# Patient Record
Sex: Male | Born: 2005 | Race: White | Hispanic: No | Marital: Single | Smoking: Never smoker
Health system: Southern US, Community
[De-identification: ages and names within clinical notes are randomized; demographics above are authoritative.]

## PROBLEM LIST (undated history)

## (undated) ENCOUNTER — Ambulatory Visit: Source: Home / Self Care

## (undated) ENCOUNTER — Emergency Department (HOSPITAL_BASED_OUTPATIENT_CLINIC_OR_DEPARTMENT_OTHER): Admission: EM | Source: Home / Self Care

## (undated) DIAGNOSIS — IMO0002 Reserved for concepts with insufficient information to code with codable children: Secondary | ICD-10-CM

## (undated) HISTORY — DX: Reserved for concepts with insufficient information to code with codable children: IMO0002

## (undated) HISTORY — PX: CIRCUMCISION: SUR203

---
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
## 2023-05-31 ENCOUNTER — Emergency Department (HOSPITAL_BASED_OUTPATIENT_CLINIC_OR_DEPARTMENT_OTHER)
Admission: EM | Admit: 2023-05-31 | Discharge: 2023-05-31 | Disposition: A | Discharge status: Home / Self Care | Attending: Emergency Medicine | Admitting: Emergency Medicine

## 2023-05-31 ENCOUNTER — Encounter: Payer: Self-pay | Admitting: *Deleted

## 2023-05-31 ENCOUNTER — Ambulatory Visit
Admission: EM | Admit: 2023-05-31 | Discharge: 2023-05-31 | Disposition: A | Discharge status: Home / Self Care | Attending: Emergency Medicine | Admitting: Emergency Medicine

## 2023-05-31 ENCOUNTER — Other Ambulatory Visit: Payer: Self-pay

## 2023-05-31 DIAGNOSIS — Y9372 Activity, wrestling: Secondary | ICD-10-CM | POA: Diagnosis not present

## 2023-05-31 DIAGNOSIS — S4992XA Unspecified injury of left shoulder and upper arm, initial encounter: Secondary | ICD-10-CM

## 2023-05-31 DIAGNOSIS — M25512 Pain in left shoulder: Secondary | ICD-10-CM | POA: Insufficient documentation

## 2023-05-31 NOTE — ED Triage Notes (Addendum)
 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 States he was "coming off the top rope" and was "drop kicked on left shoulder". Has full ROM but c/o "very sore towards" my neck"

## 2023-05-31 NOTE — ED Provider Notes (Signed)
 [name]    CSN: [id number] Arrival date & time: 05/31/23  1206      History   Chief Complaint Chief Complaint  Patient presents with   Shoulder Injury    HPI Aaron Vazquez is a 18 y.o. male.  Here with left shoulder pain/injury that occurred last night Was wrestling, kicked in shoulder by opponent, also landed on shoulder. Pain today rated 5/10 in the muscles leading to left neck, and anterior shoulder. 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 Authorizing Provider  cetirizine  (ZYRTEC  ALLERGY) 10 MG tablet Take 1 tablet (10 mg total) by mouth daily. 05/16/22  Yes Herrin, Naishai R, MD  fluticasone  (FLONASE ) 50 MCG/ACT nasal spray Place 1 spray into both nostrils daily. 05/16/22  Yes Herrin, Alric Jensen, MD    Family History Family History  Adopted: Yes  Problem Relation Age of Onset   Hypertension Other        family history    Social History Social History   Tobacco Use   Smoking status: Never   Smokeless tobacco: Never  Substance Use Topics   Alcohol use: Never   Drug use: Never     Allergies   Cough dm [dextromethorphan polistirex er]   Review of Systems Review of Systems As per HPI  Physical Exam Triage Vital Signs ED Triage Vitals  Encounter Vitals Group     BP      Systolic BP Percentile      Diastolic BP Percentile      Pulse      Resp      Temp      Temp src      SpO2      Weight      Height      Head Circumference      Peak  Flow      Pain Score      Pain Loc      Pain Education      Exclude from Growth Chart    No data found.  Updated Vital Signs BP 121/77 (BP Location: Right Arm)   Pulse 69   Temp 98.4 F (36.9 C) (Oral)   Resp 16   SpO2 98%    Physical Exam Vitals and nursing note reviewed.  Constitutional:      General: He is not in acute distress. HENT:     Mouth/Throat:     Pharynx: Oropharynx is clear.  Cardiovascular:     Rate and Rhythm: Normal rate and regular rhythm.     Pulses: Normal pulses.     Heart sounds: Normal heart sounds.  Pulmonary:     Effort: Pulmonary effort is normal.     Breath sounds: Normal breath sounds.  Musculoskeletal:     Right shoulder: Normal.     Left shoulder: Tenderness and bony tenderness present. No swelling or deformity. Normal range of motion.  Arms:     Cervical back: Normal range of motion. No rigidity or tenderness.     Comments: Full ROM of left shoulder without pain. There is muscular and bony tenderness. Strength 5/5, sensation equal and intact. Radial pulses 2+  Lymphadenopathy:     Cervical: No cervical adenopathy.  Skin:    General: Skin is warm and dry.     Capillary Refill: Capillary refill takes less than 2 seconds.  Neurological:     Mental Status: He is alert and oriented to person, place, and time.     UC Treatments / Results  Labs (all labs ordered are listed, but only abnormal results are displayed) Labs Reviewed - No data to display  EKG  Radiology No results found.  Procedures Procedures  Medications Ordered in UC Medications - No data to display  Initial Impression / Assessment and Plan / UC Course  I have reviewed the triage vital signs and the nursing notes.  Pertinent labs & imaging results that were available during my care of the patient were reviewed by me and considered in my medical decision making (see chart for details).  No xray imaging is available at this clinic today Lower concern for  bony abnormality at this time. Recommend supportive care with tylenol /ibuprofen  every 4-6 hours, hot pad or ice, and monitoring. If worsening pain or inability to move the arm, advised re-evaluation for imaging, either at different site with available imaging or in the ED.  I have placed an order for outpatient xray, patient can have done if he chooses.  Initially ordered imaging for [facility]. However staff informed patient to go to [facility]. A new order is placed for different site - other order can be discontinued if needed.  Final Clinical Impressions(s) / UC Diagnoses   Final diagnoses:  Injury of left shoulder, initial encounter     Discharge Instructions      I have placed an order for left shoulder xray to be done at [facility]  Please go through the ED and state you are there for an xray with an order that has already been placed. Do not check in as a patient, because you have already been seen by urgent care!     ED Prescriptions   None    PDMP not reviewed this encounter.   Hikari Tripp, Ivette Marks, [place] 05/31/23 1328

## 2023-05-31 NOTE — Discharge Instructions (Signed)
 I have placed an order for left shoulder xray to be done at [facility]  Please go through the ED and state you are there for an xray with an order that has already been placed. Do not check in as a patient, because you have already been seen by urgent care!

## 2023-06-05 ENCOUNTER — Other Ambulatory Visit: Payer: Self-pay

## 2023-06-05 ENCOUNTER — Emergency Department (HOSPITAL_BASED_OUTPATIENT_CLINIC_OR_DEPARTMENT_OTHER)

## 2023-06-05 ENCOUNTER — Emergency Department (HOSPITAL_BASED_OUTPATIENT_CLINIC_OR_DEPARTMENT_OTHER)
Admission: EM | Admit: 2023-06-05 | Discharge: 2023-06-05 | Disposition: A | Discharge status: Home / Self Care | Attending: Emergency Medicine | Admitting: Emergency Medicine

## 2023-06-05 ENCOUNTER — Encounter (HOSPITAL_BASED_OUTPATIENT_CLINIC_OR_DEPARTMENT_OTHER): Payer: Self-pay | Admitting: Emergency Medicine

## 2023-06-05 DIAGNOSIS — Y9372 Activity, wrestling: Secondary | ICD-10-CM | POA: Insufficient documentation

## 2023-06-05 DIAGNOSIS — W500XXA Accidental hit or strike by another person, initial encounter: Secondary | ICD-10-CM | POA: Insufficient documentation

## 2023-06-05 DIAGNOSIS — M25512 Pain in left shoulder: Secondary | ICD-10-CM | POA: Diagnosis not present

## 2023-06-05 NOTE — ED Triage Notes (Signed)
 Pt with left shoulder pain after injury last Saturday. Was seen at that time.  Reports stretching today and felt a "pop" and is wondering if he re injured something.

## 2023-06-05 NOTE — Discharge Instructions (Addendum)
 Tylenol  or Motrin  as needed for pain.  Can ice and heat the area.  X-ray here does not show any evidence of broken bones or dislocations.  Make sure to follow-up outpatient, return for any worsening symptoms

## 2023-06-05 NOTE — ED Provider Notes (Signed)
 [facility] Provider Note   CSN: [id number] Arrival date & time: 06/05/23  1555    History  Chief Complaint  Patient presents with   Shoulder Pain    Aaron Vazquez is a 18 y.o. male here for evaluation of left shoulder pain.  Initial injury on Saturday.  Was wrestling again when he hit his shoulder.  No numbness or weakness.  He thought he felt something "pop."  He has full range of motion however was concerned he had dislocated his shoulder.  No head injury, neck injury.  No redness or warmth.  Has been wearing a sling at home intermittently.  HPI     Home Medications Prior to Admission medications   Medication Sig Start Date End Date Taking? Authorizing Provider  cetirizine  (ZYRTEC  ALLERGY) 10 MG tablet Take 1 tablet (10 mg total) by mouth daily. 05/16/22   Herrin, Naishai R, MD  fluticasone  (FLONASE ) 50 MCG/ACT nasal spray Place 1 spray into both nostrils daily. 05/16/22   Herrin, Naishai R, MD      Allergies    Cough dm [dextromethorphan polistirex er]    Review of Systems   Review of Systems  Constitutional: Negative.   HENT: Negative.    Respiratory: Negative.    Cardiovascular: Negative.   Gastrointestinal: Negative.   Genitourinary: Negative.   Musculoskeletal:        Left shoulder pain  Skin: Negative.   Neurological: Negative.   All other systems reviewed and are negative.   Physical Exam Updated Vital Signs BP 118/64 (BP Location: Right Arm)   Pulse (!) 57   Temp 97.9 F (36.6 C) (Oral)   Resp 18   SpO2 100%  Physical Exam Vitals and nursing note reviewed.  Constitutional:      General: He is not in acute distress.    Appearance: He is well-developed. He is not ill-appearing, toxic-appearing or diaphoretic.  HENT:     Head: Atraumatic.  Eyes:     Pupils: Pupils are equal, round, and reactive to light.  Cardiovascular:     Rate and Rhythm: Normal rate and regular rhythm.     Pulses:          Radial  pulses are 2+ on the right side and 2+ on the left side.  Pulmonary:     Effort: Pulmonary effort is normal. No respiratory distress.  Abdominal:     General: There is no distension.     Palpations: Abdomen is soft.  Musculoskeletal:        General: Normal range of motion.       Arms:     Cervical back: Normal range of motion and neck supple.     Comments: Mild tenderness left lateral shoulder.  Nontender clavicle, scapula.  Full range of motion.  Negative Hawkins, empty can.  No obvious dislocation.  Skin:    General: Skin is warm and dry.     Capillary Refill: Capillary refill takes less than 2 seconds.     Comments: No edema, erythema, warmth, fluctuance induration.  Neurological:     General: No focal deficit present.     Mental Status: He is alert and oriented to person, place, and time.     Sensory: Sensation is intact.     Motor: Motor function is intact.     Comments: Intact sensation Equal strength     ED Results / Procedures / Treatments   Labs (all labs ordered are listed, but only  abnormal results are displayed) Labs Reviewed - No data to display  EKG None  Radiology DG Shoulder Left Result Date: 06/05/2023 CLINICAL DATA:  Popping sensation after injury to the left shoulder during wrestling EXAM: LEFT SHOULDER - 3 VIEW COMPARISON:  Left shoulder radiograph dated 03/31/2023 FINDINGS: There is no evidence of fracture or dislocation. There is no evidence of arthropathy or other focal bone abnormality. Soft tissues are unremarkable. IMPRESSION: No acute fracture or dislocation. Electronically Signed   By: Limin  Xu M.D.   On: 06/05/2023 16:19    Procedures Procedures    Medications Ordered in ED Medications - No data to display  ED Course/ Medical Decision Making/ A&P   18 year old here for evaluation of left shoulder pain. Initial injury on Saturday. Was wrestling he felt like his left shoulder "popped."  He has had full range of motion however is concerned  that it may happen again.  He is neurovascularly intact.  He has no obvious skin changes to suggest septic joint, gout, hemarthrosis.  No bony tenderness.  No base dislocation.  Will plan on imaging  Imaging personally viewed and interpreted:  No fracture, dislocation or effusion.  Encouraged symptomatic management, rest and sling, follow-up outpatient.  Discussed possibility of recurrence if he did have a dislocation.  He will return for new or worsening symptoms.  Follow up with orthopedics  The patient has been appropriately medically screened and/or stabilized in the ED. I have low suspicion for any other emergent medical condition which would require further screening, evaluation or treatment in the ED or require inpatient management.  Patient is hemodynamically stable and in no acute distress.  Patient able to ambulate in department prior to ED.  Evaluation does not show acute pathology that would require ongoing or additional emergent interventions while in the emergency department or further inpatient treatment.  I have discussed the diagnosis with the patient and answered all questions.  Pain is been managed while in the emergency department and patient has no further complaints prior to discharge.  Patient is comfortable with plan discussed in room and is stable for discharge at this time.  I have discussed strict return precautions for returning to the emergency department.  Patient was encouraged to follow-up with PCP/specialist refer to at discharge.                                    Medical Decision Making Amount and/or Complexity of Data Reviewed Independent Historian: parent External Data Reviewed: radiology and notes. Radiology: ordered and independent interpretation performed. Decision-making details documented in ED Course.  Risk OTC drugs. Decision regarding hospitalization. Diagnosis or treatment significantly limited by social determinants of  health.          Final Clinical Impression(s) / ED Diagnoses Final diagnoses:  Acute pain of left shoulder    Rx / DC Orders ED Discharge Orders     None         Mayuri Staples A, PA-C 06/05/23 2323    Tonya Fredrickson, MD 06/06/23 1025

## 2023-08-22 DIAGNOSIS — Z113 Encounter for screening for infections with a predominantly sexual mode of transmission: Secondary | ICD-10-CM | POA: Diagnosis not present

## 2023-08-22 DIAGNOSIS — R5383 Other fatigue: Secondary | ICD-10-CM | POA: Diagnosis not present

## 2023-08-22 DIAGNOSIS — Z1329 Encounter for screening for other suspected endocrine disorder: Secondary | ICD-10-CM | POA: Diagnosis not present

## 2023-08-22 DIAGNOSIS — Z0000 Encounter for general adult medical examination without abnormal findings: Secondary | ICD-10-CM | POA: Diagnosis not present

## 2023-08-26 ENCOUNTER — Encounter: Payer: Self-pay | Admitting: Pediatrics

## 2023-09-01 ENCOUNTER — Encounter: Admitting: Family

## 2023-09-01 NOTE — Progress Notes (Signed)
 Erroneous encounter-disregard

## 2023-11-14 DIAGNOSIS — R5381 Other malaise: Secondary | ICD-10-CM | POA: Diagnosis not present

## 2023-11-14 DIAGNOSIS — R5383 Other fatigue: Secondary | ICD-10-CM | POA: Diagnosis not present
